# Patient Record
Sex: Male | Born: 1995 | Race: White | Hispanic: No | Marital: Single | Smoking: Current every day smoker
Health system: Southern US, Community
[De-identification: ages and names within clinical notes are randomized; demographics above are authoritative.]

## PROBLEM LIST (undated history)

## (undated) DIAGNOSIS — M549 Dorsalgia, unspecified: Secondary | ICD-10-CM

## (undated) DIAGNOSIS — L708 Other acne: Secondary | ICD-10-CM

## (undated) DIAGNOSIS — F913 Oppositional defiant disorder: Secondary | ICD-10-CM

## (undated) DIAGNOSIS — F909 Attention-deficit hyperactivity disorder, unspecified type: Secondary | ICD-10-CM

## (undated) HISTORY — DX: Attention-deficit hyperactivity disorder, unspecified type: F90.9

## (undated) HISTORY — DX: Oppositional defiant disorder: F91.3

## (undated) HISTORY — DX: Dorsalgia, unspecified: M54.9

## (undated) HISTORY — DX: Other acne: L70.8

---
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
## 2018-04-14 ENCOUNTER — Encounter (HOSPITAL_COMMUNITY): Payer: Self-pay | Admitting: *Deleted

## 2018-04-14 ENCOUNTER — Emergency Department (HOSPITAL_COMMUNITY)
Admission: EM | Admit: 2018-04-14 | Discharge: 2018-04-14 | Disposition: A | Discharge status: Home / Self Care | Payer: Medicaid Other | Attending: Emergency Medicine | Admitting: Emergency Medicine

## 2018-04-14 ENCOUNTER — Other Ambulatory Visit: Payer: Self-pay

## 2018-04-14 ENCOUNTER — Emergency Department (HOSPITAL_COMMUNITY): Payer: Medicaid Other

## 2018-04-14 DIAGNOSIS — R109 Unspecified abdominal pain: Secondary | ICD-10-CM

## 2018-04-14 DIAGNOSIS — F17200 Nicotine dependence, unspecified, uncomplicated: Secondary | ICD-10-CM | POA: Diagnosis not present

## 2018-04-14 DIAGNOSIS — R8271 Bacteriuria: Secondary | ICD-10-CM

## 2018-04-14 DIAGNOSIS — N23 Unspecified renal colic: Secondary | ICD-10-CM | POA: Insufficient documentation

## 2018-04-14 DIAGNOSIS — N390 Urinary tract infection, site not specified: Secondary | ICD-10-CM | POA: Diagnosis not present

## 2018-04-14 DIAGNOSIS — R1032 Left lower quadrant pain: Secondary | ICD-10-CM | POA: Diagnosis present

## 2018-04-14 LAB — CBC WITH DIFFERENTIAL/PLATELET
Basophils Absolute: 0 10*3/uL (ref 0.0–0.1)
Basophils Relative: 0 %
Eosinophils Absolute: 0.3 10*3/uL (ref 0.0–0.7)
Eosinophils Relative: 1 %
HEMATOCRIT: 41.5 % (ref 39.0–52.0)
HEMOGLOBIN: 13.7 g/dL (ref 13.0–17.0)
LYMPHS ABS: 4.5 10*3/uL — AB (ref 0.7–4.0)
Lymphocytes Relative: 26 %
MCH: 30 pg (ref 26.0–34.0)
MCHC: 33 g/dL (ref 30.0–36.0)
MCV: 90.8 fL (ref 78.0–100.0)
MONO ABS: 0.9 10*3/uL (ref 0.1–1.0)
Monocytes Relative: 5 %
NEUTROS ABS: 11.9 10*3/uL — AB (ref 1.7–7.7)
NEUTROS PCT: 68 %
Platelets: 269 10*3/uL (ref 150–400)
RBC: 4.57 MIL/uL (ref 4.22–5.81)
RDW: 12.9 % (ref 11.5–15.5)
WBC: 17.6 10*3/uL — ABNORMAL HIGH (ref 4.0–10.5)

## 2018-04-14 LAB — BASIC METABOLIC PANEL
ANION GAP: 10 (ref 5–15)
BUN: 13 mg/dL (ref 6–20)
CALCIUM: 9.5 mg/dL (ref 8.9–10.3)
CHLORIDE: 106 mmol/L (ref 101–111)
CO2: 22 mmol/L (ref 22–32)
Creatinine, Ser: 1.11 mg/dL (ref 0.61–1.24)
GFR calc non Af Amer: >60 mL/min (ref >60)
GLUCOSE: 153 mg/dL — AB (ref 65–99)
Potassium: 3.4 mmol/L — ABNORMAL LOW (ref 3.5–5.1)
Sodium: 138 mmol/L (ref 135–145)

## 2018-04-14 LAB — URINALYSIS, ROUTINE W REFLEX MICROSCOPIC
Glucose, UA: NEGATIVE mg/dL
Ketones, ur: 40 mg/dL — AB
NITRITE: NEGATIVE
Protein, ur: 100 mg/dL — AB
Specific Gravity, Urine: >1.03 — ABNORMAL HIGH (ref 1.005–1.030)
pH: 5.5 (ref 5.0–8.0)

## 2018-04-14 LAB — URINALYSIS, MICROSCOPIC (REFLEX)

## 2018-04-14 MED ORDER — HYDROCODONE-ACETAMINOPHEN 5-325 MG PO TABS: 1.0000 | ORAL_TABLET | ORAL | 0 refills | Status: DC | PRN

## 2018-04-14 MED ORDER — TAMSULOSIN HCL 0.4 MG PO CAPS: 0.4000 mg | ORAL_CAPSULE | Freq: Every day | ORAL | 0 refills | Status: DC

## 2018-04-14 MED ORDER — KETOROLAC TROMETHAMINE 30 MG/ML IJ SOLN
15.0000 mg | Freq: Once | INTRAMUSCULAR | Status: AC
  Administered 2018-04-14: 15 mg via INTRAVENOUS
  Filled 2018-04-14: qty 1

## 2018-04-14 MED ORDER — FENTANYL CITRATE (PF) 100 MCG/2ML IJ SOLN
50.0000 ug | Freq: Once | INTRAMUSCULAR | Status: AC
  Administered 2018-04-14: 50 ug via INTRAVENOUS
  Filled 2018-04-14: qty 2

## 2018-04-14 MED ORDER — ONDANSETRON HCL 4 MG/2ML IJ SOLN
4.0000 mg | Freq: Once | INTRAMUSCULAR | Status: AC
  Administered 2018-04-14: 4 mg via INTRAVENOUS
  Filled 2018-04-14: qty 2

## 2018-04-14 MED ORDER — SODIUM CHLORIDE 0.9 % IV BOLUS
1000.0000 mL | Freq: Once | INTRAVENOUS | Status: AC
  Administered 2018-04-14: 1000 mL via INTRAVENOUS

## 2018-04-14 MED ORDER — ONDANSETRON HCL 4 MG PO TABS: 4.0000 mg | ORAL_TABLET | Freq: Four times a day (QID) | ORAL | 0 refills | Status: DC | PRN

## 2018-04-14 MED ORDER — CEPHALEXIN 500 MG PO CAPS: 500.0000 mg | ORAL_CAPSULE | Freq: Four times a day (QID) | ORAL | 0 refills | Status: DC

## 2018-04-14 MED ORDER — SODIUM CHLORIDE 0.9 % IV SOLN
1.0000 g | Freq: Once | INTRAVENOUS | Status: AC
  Administered 2018-04-14: 1 g via INTRAVENOUS
  Filled 2018-04-14: qty 10

## 2018-04-14 MED ORDER — IBUPROFEN 600 MG PO TABS: 600.0000 mg | ORAL_TABLET | Freq: Four times a day (QID) | ORAL | 0 refills | Status: DC | PRN

## 2018-04-14 NOTE — ED Triage Notes (Signed)
Pt c/o waking up with left side flank pain with nausea, denies any hx of problems before,

## 2018-04-14 NOTE — ED Provider Notes (Signed)
[facility] EMERGENCY DEPARTMENT Provider Note   CSN: [id number] Arrival date & time: 04/14/18  0605     History   Chief Complaint Chief Complaint  Patient presents with  . Flank Pain    HPI Matthew Peck is a 22 y.o. male.  The history is provided by the patient.  Flank Pain  This is a new problem. Episode onset: just prior to arrival. The problem occurs constantly. The problem has been rapidly worsening. Associated symptoms include abdominal pain. Exacerbated by: palpation and movement. Nothing relieves the symptoms.  Patient reports sudden onset left flank pain that radiates to his abdomen. He has never had this pain before.  He is otherwise been well.  He reports associated nausea/vomiting. He reports the pain at times will radiate down to his groin and testicles. He reports difficulty urinating  Past Medical History:  Diagnosis Date  . ADD (attention deficit disorder with hyperactivity)   . Back pain   . ODD (oppositional defiant disorder)   . Other acne    facial      Patient Active Problem List   Diagnosis Date Noted  . ADHD (attention deficit hyperactivity disorder), combined type 11/17/2013  . Muscle strain of right upper back 07/28/2013  . Pain in joint, shoulder region 07/28/2013    History reviewed. No pertinent surgical history.      Home Medications    Prior to Admission medications   Medication Sig Start Date End Date Taking? Authorizing Provider  naproxen (NAPROSYN) 500 MG tablet Take 1 tablet (500 mg total) by mouth 2 (two) times daily with a meal. 10/26/14   Oxford, Anselm Pancoast, FNP    Family History No family history on file.  Social History Social History   Tobacco Use  . Smoking status: Current Every Day Smoker  . Smokeless tobacco: Never Used  Substance Use Topics  . Alcohol use: No  . Drug use: No     Allergies   Neosporin [neomycin-polymyxin-gramicidin] and Polysporin [bacitracin-polymyxin b]   Review of Systems Review of  Systems  Constitutional: Negative for fever.  Gastrointestinal: Positive for abdominal pain, nausea and vomiting.  Genitourinary: Positive for flank pain, frequency and testicular pain.  All other systems reviewed and are negative.    Physical Exam Updated Vital Signs BP 120/75   Pulse (!) 52   Temp (!) 97.4 F (36.3 C) (Oral)   Resp (!) 21   Ht 1.829 m (6')   Wt 77.1 kg (170 lb)   SpO2 100%   BMI 23.06 kg/m   Physical Exam CONSTITUTIONAL: Well developed/well nourished, uncomfortable appearing HEAD: Normocephalic/atraumatic EYES: EOMI/PERRL ENMT: Mucous membranes moist NECK: supple no meningeal signs SPINE/BACK:entire spine nontender CV: S1/S2 noted, no murmurs/rubs/gallops noted LUNGS: Lungs are clear to auscultation bilaterally, no apparent distress ABDOMEN: soft, left lower quadrant tenderness, no rebound or guarding, bowel sounds noted throughout abdomen GU: Left Cva tenderness, no inguinal hernia, testicles descended bilaterally, no erythema, mild scrotal tenderness noted Chaperone present for exam NEURO: Pt is awake/alert/appropriate, moves all extremitiesx4.  He is ambulatory EXTREMITIES:  full ROM SKIN: warm, color normal PSYCH: Anxious and walking around the room  ED Treatments / Results  Labs (all labs ordered are listed, but only abnormal results are displayed) Labs Reviewed  BASIC METABOLIC PANEL - Abnormal; Notable for the following components:      Result Value   Potassium 3.4 (*)    Glucose, Bld 153 (*)    All other components within normal limits  CBC WITH DIFFERENTIAL/PLATELET -  Abnormal; Notable for the following components:   WBC 17.6 (*)    Neutro Abs 11.9 (*)    Lymphs Abs 4.5 (*)    All other components within normal limits  URINALYSIS, ROUTINE W REFLEX MICROSCOPIC    EKG None  Radiology No results found.  Procedures Procedures    Medications Ordered in ED Medications  sodium chloride 0.9 % bolus 1,000 mL (1,000 mLs Intravenous  New Bag/Given 04/14/18 0647)  ketorolac (TORADOL) 30 MG/ML injection 15 mg (15 mg Intravenous Given 04/14/18 0641)  fentaNYL (SUBLIMAZE) injection 50 mcg (50 mcg Intravenous Given 04/14/18 0642)  ondansetron (ZOFRAN) injection 4 mg (4 mg Intravenous Given 04/14/18 0641)     Initial Impression / Assessment and Plan / ED Course  I have reviewed the triage vital signs and the nursing notes.  Pertinent labs  results that were available during my care of the patient were reviewed by me and considered in my medical decision making (see chart for details).     7:00 AM Patient with classic presentation of ureteral stone.  He is now improving with medicines.  We are awaiting urinalysis this time Signed out to dr yelverton at shift change If pain resolved and urine only shows hematuria, he can be safely discharged without CT imaging  Final Clinical Impressions(s) / ED Diagnoses   Final diagnoses:  Flank pain    ED Discharge Orders    None       Zadie RhineWickline, Avilyn Virtue, MD 04/14/18 [phone]

## 2018-04-14 NOTE — Discharge Instructions (Signed)
Call and make an appointment to follow-up closely with the urologist.  Return immediately for fever, worsening pain, persistent vomiting or for any concerns.

## 2018-04-14 NOTE — ED Provider Notes (Signed)
Signed out by overnight emergency provider.  Patient presents with left-sided flank pain radiating to his abdomen associated with nausea and vomiting.  No history of previous renal stones.  Pain was almost completely resolved after IV Toradol and fentanyl.  Patient did have many bacteria in his urine.  No flank tenderness to percussion.  No fevers or chills.  CT renal stone study was obtained to verify stone.  Patient does have a 4 mm stone at the left distal ureter and minimal hydronephrosis but no obvious evidence of pyelonephritis.  Discussed with Dr. Laverle PatterBorden.  Advised sending urine for culture, starting antibiotics and close follow-up in the urology office.  Patient and family were also given strict return precautions and understand need to return immediately for fever, worsening pain, persistent vomiting or for other concerns.   Loren RacerYelverton, Brnadon Eoff, MD 04/14/18 1213

## 2018-04-15 LAB — URINE CULTURE: CULTURE: NO GROWTH

## 2021-08-02 ENCOUNTER — Encounter: Payer: Self-pay | Admitting: Nurse Practitioner

## 2021-08-02 ENCOUNTER — Other Ambulatory Visit: Payer: Self-pay

## 2021-08-02 ENCOUNTER — Ambulatory Visit (INDEPENDENT_AMBULATORY_CARE_PROVIDER_SITE_OTHER): Payer: 59 | Admitting: Nurse Practitioner

## 2021-08-02 VITALS — BP 122/71 | HR 74 | Temp 98.5°F | Ht 70.0 in | Wt 193.0 lb

## 2021-08-02 DIAGNOSIS — M79641 Pain in right hand: Secondary | ICD-10-CM

## 2021-08-02 MED ORDER — PREDNISONE 10 MG (21) PO TBPK: ORAL_TABLET | ORAL | 0 refills | Status: AC
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
## 2021-08-02 NOTE — Patient Instructions (Signed)
Hand Pain °Many things can cause hand pain. Some common causes are: °An injury. °Repeating the same movement with your hand over and over (overuse). °Osteoporosis. °Arthritis. °Lumps in the tendons or joints of the hand and wrist (ganglion cysts). °Nerve compression syndromes (carpal tunnel syndrome). °Inflammation of the tendons (tendinitis). °Infection. °Follow these instructions at home: °Pay attention to any changes in your symptoms. Take these actions to help with your discomfort: °Managing pain, stiffness, and swelling ° °Take over-the-counter and prescription medicines only as told by your health care provider. °Wear a hand splint or support as told by your health care provider. °If directed, put ice on the affected area: °Put ice in a plastic bag. °Place a towel between your skin and the bag. °Leave the ice on for 20 minutes, 2-3 times a day. °Activity °Take breaks from repetitive activity often. °Avoid activities that make your pain worse. °Minimize stress on your hands and wrists as much as possible. °Do stretches or exercises as told by your health care provider. °Do not do activities that make your pain worse. °Contact a health care provider if: °Your pain does not get better after a few days of self-care. °Your pain gets worse. °Your pain affects your ability to do your daily activities. °Get help right away if: °Your hand becomes warm, red, or swollen. °Your hand is numb or tingling. °Your hand is extremely swollen or deformed. °Your hand or fingers turn white or blue. °You cannot move your hand, wrist, or fingers. °Summary °Many things can cause hand pain. °Contact your health care provider if your pain does not get better after a few days of self care. °Minimize stress on your hands and wrists as much as possible. °Do not do activities that make your pain worse. °This information is not intended to replace advice given to you by your health care provider. Make sure you discuss any questions you have  with your health care provider. °Document Revised: 02/16/2021 Document Reviewed: 02/16/2021 °Elsevier Patient Education © 2022 [employer] ° °

## 2021-08-02 NOTE — Assessment & Plan Note (Signed)
-   Ibuprofen/Tylenol for pain and inflammation -Warm compress -Prednisone taper -Physical therapy referral Rx sent to pharmacy, education provided to patient printed handouts given. Follow-up with worsening or unresolved symptoms.

## 2021-08-09 ENCOUNTER — Other Ambulatory Visit: Payer: Self-pay

## 2021-08-09 ENCOUNTER — Ambulatory Visit: Payer: 59 | Attending: Nurse Practitioner | Admitting: Physical Therapy

## 2021-08-09 DIAGNOSIS — M79641 Pain in right hand: Secondary | ICD-10-CM | POA: Diagnosis present

## 2021-08-09 DIAGNOSIS — M62838 Other muscle spasm: Secondary | ICD-10-CM | POA: Insufficient documentation

## 2021-08-09 DIAGNOSIS — M6281 Muscle weakness (generalized): Secondary | ICD-10-CM | POA: Insufficient documentation
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
## 2021-08-09 NOTE — Patient Instructions (Signed)
Access Code: [email or web address] URL: [email or web address] Date: 08/09/2021 Prepared by: Raynelle Fanning  Exercises Standing Median Nerve Glide - 1 x daily - 7 x weekly - 1 sets - 10 reps - 5 sec on/off hold Wrist Flexor Stretch in Pronation - 1 x daily - 7 x weekly - 3 sets - 10 reps Standing Wrist Extension Stretch - 2 x daily - 7 x weekly - 1 sets - 3 reps - 30 sec hold Standing Wrist Flexion Stretch - 2 x daily - 7 x weekly - 1 sets - 3 reps - 30 sec hold

## 2021-08-14 ENCOUNTER — Ambulatory Visit: Payer: 59 | Attending: Nurse Practitioner | Admitting: Physical Therapy

## 2021-08-14 ENCOUNTER — Other Ambulatory Visit: Payer: Self-pay

## 2021-08-14 ENCOUNTER — Encounter: Payer: Self-pay | Admitting: Physical Therapy

## 2021-08-14 DIAGNOSIS — M6281 Muscle weakness (generalized): Secondary | ICD-10-CM | POA: Diagnosis present

## 2021-08-14 DIAGNOSIS — M79641 Pain in right hand: Secondary | ICD-10-CM

## 2021-08-14 DIAGNOSIS — M62838 Other muscle spasm: Secondary | ICD-10-CM | POA: Diagnosis present

## 2021-08-14 NOTE — Patient Instructions (Signed)

## 2021-08-14 NOTE — Therapy (Signed)
[facility] [address] [place], [place], [address] Phone: [phone]   Fax:  [phone]  Physical Therapy Treatment  Patient Details  Name: Matthew Peck MRN: [id number] Date of Birth: Jul 10, 1996 Referring Provider (PT): Lynnell Chad, NP   Encounter Date: 08/14/2021   PT End of Session - 08/14/21 1114     Visit Number 2    Number of Visits 8    Date for PT Re-Evaluation 09/08/21    Authorization Type [employer] - no vaso    PT Start Time 1115    PT Stop Time 1155    PT Time Calculation (min) 40 min    Activity Tolerance Patient tolerated treatment well    Behavior During Therapy [facility] for tasks assessed/performed             Past Medical History:  Diagnosis Date   ADD (attention deficit disorder with hyperactivity)    Back pain    ODD (oppositional defiant disorder)    Other acne    facial      History reviewed. No pertinent surgical history.  There were no vitals filed for this visit.   Subjective Assessment - 08/14/21 1119     Subjective Still feeling tingling in digits 3-5 and some throbbing in distal forearm and ring finger.    Patient Stated Goals to get hand stronger and decrease pain    Currently in Pain? Yes    Pain Score 4     Pain Location Hand    Pain Orientation Right    Pain Descriptors / Indicators Tingling;Throbbing    Pain Type Chronic pain                               [facility] Adult PT Treatment/Exercise - 08/14/21 0001       Self-Care   Self-Care Other Self-Care Comments    Other Self-Care Comments  self MFR with ball to left infraspinatus in standing; sx reproduced      Manual Therapy   Manual Therapy Soft tissue mobilization;Passive ROM    Manual therapy comments Skilled palpation and monitoring of soft tissues during DN    Soft tissue mobilization STM to right infraspinatus and lats; IASTM to left forearm flexors and extensors; along ulnar and median nerve distribution     Passive ROM into wrist extension              Trigger Point Dry Needling - 08/14/21 0001     Consent Given? Yes    Education Handout Provided Yes    Muscles Treated Upper Quadrant Infraspinatus;Latissimus dorsi;Brachioradialis    Muscles Treated Wrist/Hand Flexor carpi ulnaris   flexor digitorum, interossei b/w digits 4/5; abd digiti minimi   Dry Needling Comments right    Infraspinatus Response Twitch response elicited;Palpable increased muscle length    Latissimus dorsi Response Twitch response elicited;Palpable increased muscle length    Brachioradialis Response Palpable increased muscle length    Flexor carpi ulnaris Response Palpable increased muscle length                   PT Education - 08/14/21 1205     Education Details DN education and aftercare    Person(s) Educated Patient    Methods Explanation;Handout    Comprehension Verbalized understanding              PT Short Term Goals - 08/09/21 1452       PT SHORT TERM  GOAL #1   Title Ind with initial HEP    Time 2    Period Weeks    Status New    Target Date 08/23/21               PT Long Term Goals - 08/09/21 1452       PT LONG TERM GOAL #1   Title Ind with advanced HEP    Time 4    Period Weeks    Status New    Target Date 09/08/21      PT LONG TERM GOAL #2   Title Patient to report decreased pain and tingling in the right hand with use by 80% or more    Time 4    Period Weeks    Status New      PT LONG TERM GOAL #3   Title Patient to demonstrate improved right hand grip strength to within 10# of left baseline (93#)    Time 4    Period Weeks    Status New      PT LONG TERM GOAL #4   Title Patient to report improved functional strength in the right hand at work by 75% or more.    Time 4    Period Weeks    Status New                   Plan - 08/14/21 1212     Clinical Impression Statement Patient returns for first f/u visit after eval. He reports  compliance with HEP and feels some improvement in mobilty. Tingling and throbbing continues in digits 3-5 and distal ant forearm. Infraspinatus was assessed for TPs and pt was able to reproduce sx with trigger point release with ball. Initial trial of DN was done to IS as well as to lats, wrist flexors/extensors and hand (interossei and abductor digiti minimi) all with postive response. Cervical spine mobility is WNL, Ulnar nerve tension is still WNL. Median nerve with + tension. Noticeable decrease in wrist flexor tension after initial visit, but patient reporting hand felt tight at end of session. Patient will continue to benefit from MFR/STM to right forearm and infraspinatus; and median nerve glides.    PT Frequency 2x / week    PT Duration 4 weeks    PT Treatment/Interventions ADLs/Self Care Home Management;Moist Heat;Therapeutic exercise;Therapeutic activities;Manual techniques;Dry needling;Patient/family education;Joint Manipulations;Scar mobilization    PT Next Visit Plan Assess response to DN, median nerve flossing and stretches; continue with MFR/STM/DN to right forearm, infraspinatus and hand; grip strengthening, postural ed and strengthening.    PT Home Exercise Plan [email or web address] - med nerve glide, wrist flex/ext stretches    Consulted and Agree with Plan of Care Patient             Patient will benefit from skilled therapeutic intervention in order to improve the following deficits and impairments:  Decreased range of motion, Impaired UE functional use, Increased muscle spasms, Pain, Decreased strength, Impaired flexibility  Visit Diagnosis: Pain in right hand  Muscle weakness (generalized)  Other muscle spasm     Problem List Patient Active Problem List   Diagnosis Date Noted   Hand pain, right 08/02/2021   ADHD (attention deficit hyperactivity disorder), combined type 11/17/2013   Muscle strain of right upper back 07/28/2013   Pain in joint, shoulder region 07/28/2013    Solon Palm, PT 08/14/2021, 12:25 PM  [facility] [address] [place], [place], [address] Phone: [phone]  Fax:  [phone]  Name: Matthew Peck MRN: [id number] Date of Birth: January 03, 1996

## 2021-08-16 ENCOUNTER — Encounter: Payer: Self-pay | Admitting: Physical Therapy

## 2021-08-16 ENCOUNTER — Ambulatory Visit: Payer: 59 | Admitting: Physical Therapy

## 2021-08-16 ENCOUNTER — Other Ambulatory Visit: Payer: Self-pay

## 2021-08-16 DIAGNOSIS — M62838 Other muscle spasm: Secondary | ICD-10-CM

## 2021-08-16 DIAGNOSIS — M79641 Pain in right hand: Secondary | ICD-10-CM

## 2021-08-16 DIAGNOSIS — M6281 Muscle weakness (generalized): Secondary | ICD-10-CM

## 2021-08-16 NOTE — Therapy (Signed)
[facility] [address] [place], [place], [address] Phone: [phone]   Fax:  [phone]  Physical Therapy Treatment  Patient Details  Name: Matthew Peck MRN: [id number] Date of Birth: [birth year] Referring Provider (PT): Lynnell Chad, NP   Encounter Date: 08/16/2021   PT End of Session - 08/16/21 1302     Visit Number 3    Number of Visits 8    Date for PT Re-Evaluation 09/08/21    Authorization Type [employer] - no vaso    PT Start Time 1303    PT Stop Time 1344    PT Time Calculation (min) 41 min    Activity Tolerance Patient tolerated treatment well    Behavior During Therapy [facility] for tasks assessed/performed             Past Medical History:  Diagnosis Date   ADD (attention deficit disorder with hyperactivity)    Back pain    ODD (oppositional defiant disorder)    Other acne    facial      History reviewed. No pertinent surgical history.  There were no vitals filed for this visit.   Subjective Assessment - 08/16/21 1301     Subjective had a shooting pain from 4-5th digits to shoot from elbow to fingers.    Pertinent History unremarkable    Patient Stated Goals to get hand stronger and decrease pain    Currently in Pain? No/denies                [facility] PT Assessment - 08/16/21 0001       Assessment   Medical Diagnosis right hand pain    Referring Provider (PT) Lynnell Chad, NP    Onset Date/Surgical Date 12/13/20    Hand Dominance Right      Precautions   Precautions None      Restrictions   Weight Bearing Restrictions No                           [facility] Adult PT Treatment/Exercise - 08/16/21 0001       Exercises   Exercises Shoulder;Elbow;Wrist;Hand      Shoulder Exercises: Standing   Extension Strengthening;Both;20 reps;Limitations    Extension Limitations Blue XTS    Row Strengthening;Both;20 reps;Limitations    Row Limitations Blue XTS      Shoulder Exercises:  ROM/Strengthening   UBE (Upper Arm Bike) 60 RPM x6 min (forward/backward)      Hand Exercises   Other Hand Exercises Gray ball squeeze, tye dye ball squeeze with DIPs; Green therabar twists;      Wrist Exercises   Wrist Flexion Strengthening;Right;20 reps;Seated;Bar weights/barbell    Bar Weights/Barbell (Wrist Flexion) 1 lb    Wrist Extension Strengthening;Right;20 reps;Seated;Bar weights/barbell    Bar Weights/Barbell (Wrist Extension) 1 lb    Other wrist exercises R wrist extensor stretch 4x30 sec      Manual Therapy   Manual Therapy Myofascial release    Myofascial Release iASTW to R wrist flexors along median nerve line, proximal wrist extensors                       PT Short Term Goals - 08/09/21 1452       PT SHORT TERM GOAL #1   Title Ind with initial HEP    Time 2    Period Weeks    Status New    Target Date 08/23/21  PT Long Term Goals - 08/09/21 1452       PT LONG TERM GOAL #1   Title Ind with advanced HEP    Time 4    Period Weeks    Status New    Target Date 09/08/21      PT LONG TERM GOAL #2   Title Patient to report decreased pain and tingling in the right hand with use by 80% or more    Time 4    Period Weeks    Status New      PT LONG TERM GOAL #3   Title Patient to demonstrate improved right hand grip strength to within 10# of left baseline (93#)    Time 4    Period Weeks    Status New      PT LONG TERM GOAL #4   Title Patient to report improved functional strength in the right hand at work by 75% or more.    Time 4    Period Weeks    Status New                   Plan - 08/16/21 1347     Clinical Impression Statement Patient presented in clinic with reports of continued burning along medial hand. Patient has more difficulty with use of hand tools due to discomfort from burning and stinging. Patient able to tolerate resisted posturral, wrist and grip strengthening without complaint of worsening  nerve symptoms. Patient has indicated improved R wrist mobility with nerve glides. Patient provided orange theraputty for home grip strengthening. Good overall response to DN although he reported more discomfort to medial hand. Good redness response to R wrist extensors and flexors.    Stability/Clinical Decision Making Stable/Uncomplicated    Rehab Potential Excellent    PT Frequency 2x / week    PT Duration 4 weeks    PT Treatment/Interventions ADLs/Self Care Home Management;Moist Heat;Therapeutic exercise;Therapeutic activities;Manual techniques;Dry needling;Patient/family education;Joint Manipulations;Scar mobilization    PT Next Visit Plan Assess response to DN, median nerve flossing and stretches; continue with MFR/STM/DN to right forearm, infraspinatus and hand; grip strengthening, postural ed and strengthening.    PT Home Exercise Plan [email or web address] - med nerve glide, wrist flex/ext stretches    Consulted and Agree with Plan of Care Patient             Patient will benefit from skilled therapeutic intervention in order to improve the following deficits and impairments:  Decreased range of motion, Impaired UE functional use, Increased muscle spasms, Pain, Decreased strength, Impaired flexibility  Visit Diagnosis: Pain in right hand  Muscle weakness (generalized)  Other muscle spasm     Problem List Patient Active Problem List   Diagnosis Date Noted   Hand pain, right 08/02/2021   ADHD (attention deficit hyperactivity disorder), combined type 11/17/2013   Muscle strain of right upper back 07/28/2013   Pain in joint, shoulder region 07/28/2013    Marvell Fuller, PTA 08/16/2021, 1:56 PM  [facility] [address] [place], [place], [address] Phone: [phone]   Fax:  [phone]  Name: Matthew Peck MRN: [id number] Date of Birth: 01/07/1996

## 2021-08-21 ENCOUNTER — Ambulatory Visit: Payer: 59 | Admitting: Physical Therapy

## 2021-08-21 ENCOUNTER — Other Ambulatory Visit: Payer: Self-pay

## 2021-08-21 ENCOUNTER — Encounter: Payer: Self-pay | Admitting: Physical Therapy

## 2021-08-21 DIAGNOSIS — M79641 Pain in right hand: Secondary | ICD-10-CM

## 2021-08-21 DIAGNOSIS — M62838 Other muscle spasm: Secondary | ICD-10-CM

## 2021-08-21 DIAGNOSIS — M6281 Muscle weakness (generalized): Secondary | ICD-10-CM

## 2021-08-21 NOTE — Therapy (Signed)
[facility] [address] [place], [place], [address] Phone: [phone]   Fax:  [phone]  Physical Therapy Treatment  Patient Details  Name: Matthew Peck MRN: [id number] Date of Birth: 23-Jul-1996 Referring Provider (PT): Lynnell Chad, NP   Encounter Date: 08/21/2021   PT End of Session - 08/21/21 1302     Visit Number 4    Number of Visits 8    Date for PT Re-Evaluation 09/08/21    Authorization Type [employer] - no vaso    PT Start Time 1301    PT Stop Time 1340    PT Time Calculation (min) 39 min    Activity Tolerance Patient tolerated treatment well    Behavior During Therapy [facility] for tasks assessed/performed             Past Medical History:  Diagnosis Date   ADD (attention deficit disorder with hyperactivity)    Back pain    ODD (oppositional defiant disorder)    Other acne    facial      History reviewed. No pertinent surgical history.  There were no vitals filed for this visit.   Subjective Assessment - 08/21/21 1258     Subjective No pain just tightness. Less sharp, shooting pains.    Pertinent History unremarkable    Patient Stated Goals to get hand stronger and decrease pain    Currently in Pain? No/denies                [facility] PT Assessment - 08/21/21 0001       Assessment   Medical Diagnosis right hand pain    Referring Provider (PT) Lynnell Chad, NP    Onset Date/Surgical Date 12/13/20    Hand Dominance Right      Precautions   Precautions None                           [facility] Adult PT Treatment/Exercise - 08/21/21 0001       Hand Exercises   MCPJ Flexion Strengthening;Right;Limitations    MCPJ Flexion Limitations 20 reps with small tye dye ball and large gray ball    Other Hand Exercises R hand red web gripping x2 min    Other Hand Exercises R hand grip and pull, pinch of orange theraputty x5 min for fuctional training      Wrist Exercises   Wrist Flexion  Strengthening;Right;20 reps;Seated;Bar weights/barbell    Bar Weights/Barbell (Wrist Flexion) 2 lbs    Wrist Extension Strengthening;Right;20 reps;Seated;Bar weights/barbell    Bar Weights/Barbell (Wrist Extension) 2 lbs    Other wrist exercises red therabar wringing x2 min    Other wrist exercises R wrist flexion/extension, forearm supination/pronation x20 reps each with hammer      Manual Therapy   Manual Therapy Myofascial release    Myofascial Release IASTW to R wrist flexors along median nerve line, proximal wrist extensors, palmar and dorsal aspect of R hand along cut                       PT Short Term Goals - 08/09/21 1452       PT SHORT TERM GOAL #1   Title Ind with initial HEP    Time 2    Period Weeks    Status New    Target Date 08/23/21               PT Long Term Goals - 08/09/21  1452       PT LONG TERM GOAL #1   Title Ind with advanced HEP    Time 4    Period Weeks    Status New    Target Date 09/08/21      PT LONG TERM GOAL #2   Title Patient to report decreased pain and tingling in the right hand with use by 80% or more    Time 4    Period Weeks    Status New      PT LONG TERM GOAL #3   Title Patient to demonstrate improved right hand grip strength to within 10# of left baseline (93#)    Time 4    Period Weeks    Status New      PT LONG TERM GOAL #4   Title Patient to report improved functional strength in the right hand at work by 75% or more.    Time 4    Period Weeks    Status New                   Plan - 08/21/21 1428     Clinical Impression Statement Patient presenting in clinic with reports of less tingling and numbness of R hand. Patient did indicate more tightness and discomfort where the wound was. Patient instructed to try wrist stretching and nerve glides to reduce tightness. Patient able to tolerate therex well for gripping and fine motor pinching. IASTW completed to palmar and dorsal surface of the R hand  as well as wrist and proximal wrist extensors and flexors with good redness response.    Stability/Clinical Decision Making Stable/Uncomplicated    Rehab Potential Excellent    PT Frequency 2x / week    PT Duration 4 weeks    PT Treatment/Interventions ADLs/Self Care Home Management;Moist Heat;Therapeutic exercise;Therapeutic activities;Manual techniques;Dry needling;Patient/family education;Joint Manipulations;Scar mobilization    PT Next Visit Plan Assess response to DN, median nerve flossing and stretches; continue with MFR/STM/DN to right forearm, infraspinatus and hand; grip strengthening, postural ed and strengthening.    PT Home Exercise Plan [email or web address] - med nerve glide, wrist flex/ext stretches    Consulted and Agree with Plan of Care Patient             Patient will benefit from skilled therapeutic intervention in order to improve the following deficits and impairments:  Decreased range of motion, Impaired UE functional use, Increased muscle spasms, Pain, Decreased strength, Impaired flexibility  Visit Diagnosis: Pain in right hand  Muscle weakness (generalized)  Other muscle spasm     Problem List Patient Active Problem List   Diagnosis Date Noted   Hand pain, right 08/02/2021   ADHD (attention deficit hyperactivity disorder), combined type 11/17/2013   Muscle strain of right upper back 07/28/2013   Pain in joint, shoulder region 07/28/2013    Marvell Fuller, PTA 08/21/2021, 2:37 PM  [facility] [address] [place], [place], [address] Phone: [phone]   Fax:  [phone]  Name: Matthew Peck MRN: [id number] Date of Birth: 07/29/1996

## 2021-08-23 ENCOUNTER — Encounter: Payer: Self-pay | Admitting: Physical Therapy

## 2021-08-23 ENCOUNTER — Other Ambulatory Visit: Payer: Self-pay

## 2021-08-23 ENCOUNTER — Ambulatory Visit: Payer: 59 | Admitting: Physical Therapy

## 2021-08-23 DIAGNOSIS — M79641 Pain in right hand: Secondary | ICD-10-CM

## 2021-08-23 DIAGNOSIS — M62838 Other muscle spasm: Secondary | ICD-10-CM

## 2021-08-23 DIAGNOSIS — M6281 Muscle weakness (generalized): Secondary | ICD-10-CM

## 2021-08-23 NOTE — Therapy (Signed)
[facility] [address] [place], [place], [address] Phone: [phone]   Fax:  [phone]  Physical Therapy Treatment  Patient Details  Name: Matthew Peck MRN: [id number] Date of Birth: 07-Dec-1995 Referring Provider (PT): Lynnell Chad, NP   Encounter Date: 08/23/2021   PT End of Session - 08/23/21 1259     Visit Number 5    Number of Visits 8    Date for PT Re-Evaluation 09/08/21    Authorization Type [employer] - no vaso    PT Start Time 1300    PT Stop Time 1345    PT Time Calculation (min) 45 min    Activity Tolerance Patient tolerated treatment well    Behavior During Therapy [facility] for tasks assessed/performed             Past Medical History:  Diagnosis Date   ADD (attention deficit disorder with hyperactivity)    Back pain    ODD (oppositional defiant disorder)    Other acne    facial      History reviewed. No pertinent surgical history.  There were no vitals filed for this visit.   Subjective Assessment - 08/23/21 1301     Subjective Used weed eater yesterday and now distal wrist and medial palmar hand hurt.    Pertinent History unremarkable    Patient Stated Goals to get hand stronger and decrease pain    Currently in Pain? Yes    Pain Score 6     Pain Location Hand    Pain Orientation Right    Pain Descriptors / Indicators Throbbing    Pain Type Chronic pain                [facility] PT Assessment - 08/23/21 0001       Strength   Right Hand Grip (lbs) 51   60/43/50                          [facility] Adult PT Treatment/Exercise - 08/23/21 0001       Self-Care   Self-Care Other Self-Care Comments    Other Self-Care Comments  self MFR to forearm using therabar red      Shoulder Exercises: ROM/Strengthening   UBE (Upper Arm Bike) Circuit x 3 rounds: 1 min 30 RPMs; ulnar nerve floss x 1 min; digiflex red digits 4 and 5 x 1 min      Manual Therapy   Manual Therapy Soft tissue  mobilization;Myofascial release;Passive ROM    Manual therapy comments Skilled palpation and monitoring of soft tissues during DN    Soft tissue mobilization to right wrist extensors and flexors    Myofascial Release to right 4/5 interossei dorsal and palmar    Passive ROM passive stretch to wrist flexors and individual finger flexors              Trigger Point Dry Needling - 08/23/21 0001     Consent Given? Yes    Education Handout Provided Previously provided    Muscles Treated Upper Quadrant Brachioradialis    Muscles Treated Wrist/Hand Extensor carpi radialis longus/brevis;Extensor carpi ulnaris    Dry Needling Comments r    Brachioradialis Response Palpable increased muscle length    Flexor carpi ulnaris Response Palpable increased muscle length    Extensor carpi radialis longus/brevis Response Palpable increased muscle length    Extensor carpi ulnaris Response Palpable increased muscle length  PT Education - 08/23/21 1455     Education Details self MFR for forearm, ulnar nerve flossing    Person(s) Educated Patient    Methods Explanation;Demonstration    Comprehension Verbalized understanding;Returned demonstration              PT Short Term Goals - 08/23/21 1315       PT SHORT TERM GOAL #1   Title Ind with initial HEP    Status Achieved               PT Long Term Goals - 08/23/21 1458       PT LONG TERM GOAL #1   Title Ind with advanced HEP    Status On-going      PT LONG TERM GOAL #2   Title Patient to report decreased pain and tingling in the right hand with use by 80% or more    Status On-going      PT LONG TERM GOAL #3   Title Patient to demonstrate improved right hand grip strength to within 10# of left baseline (93#)    Status On-going      PT LONG TERM GOAL #4   Title Patient to report improved functional strength in the right hand at work by 75% or more.    Status On-going                    Plan - 08/23/21 1315     Clinical Impression Statement Patient reports some improvements overall. He no longer has positve ULTT with median nerve. He does get +ULTT with ulnar nerve when held 10 sec. No significant change in grip strength (51# vs 49# at eval).  He tolerated circuit for UE x 9 minutes. Increased tone in wrist extensors today. Good response to DN/manual therapy in forearm and hand with decreased tissue tension at end of session.    PT Frequency 2x / week    PT Duration 4 weeks    PT Treatment/Interventions ADLs/Self Care Home Management;Moist Heat;Therapeutic exercise;Therapeutic activities;Manual techniques;Dry needling;Patient/family education;Joint Manipulations;Scar mobilization    PT Next Visit Plan Assess response to DN, ulnar nerve flossing and stretches; continue with MFR/STM/DN to right forearm and hand; grip strengthening, postural ed and strengthening.    PT Home Exercise Plan [email or web address] - med nerve glide, wrist flex/ext stretches; added ulnar nerve flossing; MFR to forearm    Consulted and Agree with Plan of Care Patient             Patient will benefit from skilled therapeutic intervention in order to improve the following deficits and impairments:  Decreased range of motion, Impaired UE functional use, Increased muscle spasms, Pain, Decreased strength, Impaired flexibility  Visit Diagnosis: Pain in right hand  Muscle weakness (generalized)  Other muscle spasm     Problem List Patient Active Problem List   Diagnosis Date Noted   Hand pain, right 08/02/2021   ADHD (attention deficit hyperactivity disorder), combined type 11/17/2013   Muscle strain of right upper back 07/28/2013   Pain in joint, shoulder region 07/28/2013   Solon Palm, PT 08/23/2021, 3:03 PM  [facility] [address] [place], [place], [address] Phone: [phone]   Fax:  [phone]  Name: Alwin Lanigan MRN: [id number] Date of Birth:  26-Jan-1996

## 2021-08-30 ENCOUNTER — Ambulatory Visit: Payer: 59 | Admitting: Physical Therapy

## 2021-09-01 ENCOUNTER — Encounter: Payer: Managed Care, Other (non HMO) | Admitting: Physical Therapy
